# Patient Record
Sex: Male | Born: 1980 | Race: White | Hispanic: No | Marital: Married | Smoking: Former smoker
Health system: Southern US, Community
[De-identification: ages and names within clinical notes are randomized; demographics above are authoritative.]

## PROBLEM LIST (undated history)

## (undated) DIAGNOSIS — I10 Essential (primary) hypertension: Secondary | ICD-10-CM

## (undated) DIAGNOSIS — F4310 Post-traumatic stress disorder, unspecified: Secondary | ICD-10-CM

## (undated) DIAGNOSIS — F410 Panic disorder [episodic paroxysmal anxiety] without agoraphobia: Secondary | ICD-10-CM

## (undated) DIAGNOSIS — F419 Anxiety disorder, unspecified: Secondary | ICD-10-CM

## (undated) HISTORY — DX: Post-traumatic stress disorder, unspecified: F43.10

## (undated) HISTORY — DX: Anxiety disorder, unspecified: F41.9

## (undated) HISTORY — PX: SHOULDER ARTHROSCOPY: SHX128

## (undated) HISTORY — DX: Panic disorder (episodic paroxysmal anxiety): F41.0

---

## 2011-01-28 ENCOUNTER — Emergency Department (HOSPITAL_COMMUNITY)
Admission: EM | Admit: 2011-01-28 | Discharge: 2011-01-28 | Disposition: A | Discharge status: Home / Self Care | Payer: Medicaid Other | Attending: Emergency Medicine | Admitting: Emergency Medicine

## 2011-01-28 ENCOUNTER — Emergency Department (HOSPITAL_COMMUNITY): Payer: Medicaid Other

## 2011-01-28 DIAGNOSIS — W108XXA Fall (on) (from) other stairs and steps, initial encounter: Secondary | ICD-10-CM | POA: Insufficient documentation

## 2011-01-28 DIAGNOSIS — Z79899 Other long term (current) drug therapy: Secondary | ICD-10-CM | POA: Insufficient documentation

## 2011-01-28 DIAGNOSIS — IMO0002 Reserved for concepts with insufficient information to code with codable children: Secondary | ICD-10-CM | POA: Insufficient documentation

## 2011-01-28 DIAGNOSIS — M25519 Pain in unspecified shoulder: Secondary | ICD-10-CM | POA: Insufficient documentation

## 2011-01-28 DIAGNOSIS — I10 Essential (primary) hypertension: Secondary | ICD-10-CM | POA: Insufficient documentation

## 2011-03-15 ENCOUNTER — Emergency Department: Payer: Self-pay | Admitting: Emergency Medicine

## 2011-04-07 ENCOUNTER — Emergency Department (HOSPITAL_COMMUNITY)
Admission: EM | Admit: 2011-04-07 | Discharge: 2011-04-08 | Disposition: A | Discharge status: Home / Self Care | Payer: Medicaid Other | Attending: Emergency Medicine | Admitting: Emergency Medicine

## 2011-04-07 DIAGNOSIS — I10 Essential (primary) hypertension: Secondary | ICD-10-CM | POA: Insufficient documentation

## 2011-04-07 DIAGNOSIS — M25519 Pain in unspecified shoulder: Secondary | ICD-10-CM | POA: Insufficient documentation

## 2011-04-07 DIAGNOSIS — M2180 Other specified acquired deformities of unspecified limb: Secondary | ICD-10-CM | POA: Insufficient documentation

## 2011-04-07 HISTORY — DX: Essential (primary) hypertension: I10

## 2011-04-08 ENCOUNTER — Encounter: Payer: Self-pay | Admitting: *Deleted

## 2011-04-08 ENCOUNTER — Emergency Department (HOSPITAL_COMMUNITY): Payer: Medicaid Other

## 2011-04-08 MED ORDER — KETOROLAC TROMETHAMINE 30 MG/ML IJ SOLN
30.0000 mg | Freq: Once | INTRAMUSCULAR | Status: DC
  Filled 2011-04-08: qty 1

## 2011-04-08 NOTE — ED Provider Notes (Signed)
Medical screening examination/treatment/procedure(s) were performed by non-physician practitioner and as supervising physician I was immediately available for consultation/collaboration.  Lynnae Ludemann L Dyneisha Murchison, MD 04/08/11 2021 
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
## 2012-01-11 ENCOUNTER — Encounter (HOSPITAL_COMMUNITY): Payer: Self-pay

## 2012-01-11 ENCOUNTER — Emergency Department (HOSPITAL_COMMUNITY): Payer: Medicaid Other

## 2012-01-11 ENCOUNTER — Emergency Department (HOSPITAL_COMMUNITY)
Admission: EM | Admit: 2012-01-11 | Discharge: 2012-01-11 | Disposition: A | Discharge status: Home / Self Care | Payer: Medicaid Other | Attending: Emergency Medicine | Admitting: Emergency Medicine

## 2012-01-11 DIAGNOSIS — S46909A Unspecified injury of unspecified muscle, fascia and tendon at shoulder and upper arm level, unspecified arm, initial encounter: Secondary | ICD-10-CM | POA: Insufficient documentation

## 2012-01-11 DIAGNOSIS — M25511 Pain in right shoulder: Secondary | ICD-10-CM

## 2012-01-11 DIAGNOSIS — S4980XA Other specified injuries of shoulder and upper arm, unspecified arm, initial encounter: Secondary | ICD-10-CM | POA: Insufficient documentation

## 2012-01-11 DIAGNOSIS — Y93I9 Activity, other involving external motion: Secondary | ICD-10-CM | POA: Insufficient documentation

## 2012-01-11 DIAGNOSIS — Y998 Other external cause status: Secondary | ICD-10-CM | POA: Insufficient documentation

## 2012-01-11 DIAGNOSIS — Y92410 Unspecified street and highway as the place of occurrence of the external cause: Secondary | ICD-10-CM | POA: Insufficient documentation

## 2012-01-11 DIAGNOSIS — I10 Essential (primary) hypertension: Secondary | ICD-10-CM | POA: Insufficient documentation

## 2012-01-11 MED ORDER — OXYCODONE-ACETAMINOPHEN 5-325 MG PO TABS: 1.0000 | ORAL_TABLET | Freq: Four times a day (QID) | ORAL | Status: AC | PRN

## 2012-01-11 NOTE — Progress Notes (Signed)
Orthopedic Tech Progress Note Patient Details:  Jordan Wood Jun 26, 1980 [id number]  Ortho Devices Type of Ortho Device: Sling immobilizer Ortho Device/Splint Location: (R) UE Ortho Device/Splint Interventions: Application   Jennye Moccasin 01/11/2012, 7:14 PM
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
## 2015-12-05 DIAGNOSIS — D72829 Elevated white blood cell count, unspecified: Secondary | ICD-10-CM | POA: Diagnosis not present

## 2022-12-18 ENCOUNTER — Emergency Department (HOSPITAL_COMMUNITY): Payer: Medicaid Other

## 2022-12-18 ENCOUNTER — Emergency Department (HOSPITAL_COMMUNITY)
Admission: EM | Admit: 2022-12-18 | Discharge: 2022-12-18 | Disposition: A | Discharge status: Home / Self Care | Payer: Medicaid Other | Attending: Emergency Medicine | Admitting: Emergency Medicine

## 2022-12-18 ENCOUNTER — Encounter (HOSPITAL_COMMUNITY): Payer: Self-pay

## 2022-12-18 ENCOUNTER — Other Ambulatory Visit: Payer: Self-pay

## 2022-12-18 DIAGNOSIS — S42001K Fracture of unspecified part of right clavicle, subsequent encounter for fracture with nonunion: Secondary | ICD-10-CM | POA: Diagnosis not present

## 2022-12-18 DIAGNOSIS — I10 Essential (primary) hypertension: Secondary | ICD-10-CM | POA: Diagnosis not present

## 2022-12-18 DIAGNOSIS — D72829 Elevated white blood cell count, unspecified: Secondary | ICD-10-CM | POA: Insufficient documentation

## 2022-12-18 DIAGNOSIS — Z79899 Other long term (current) drug therapy: Secondary | ICD-10-CM | POA: Insufficient documentation

## 2022-12-18 DIAGNOSIS — S42001P Fracture of unspecified part of right clavicle, subsequent encounter for fracture with malunion: Secondary | ICD-10-CM

## 2022-12-18 DIAGNOSIS — R55 Syncope and collapse: Secondary | ICD-10-CM | POA: Diagnosis not present

## 2022-12-18 DIAGNOSIS — X58XXXA Exposure to other specified factors, initial encounter: Secondary | ICD-10-CM | POA: Diagnosis not present

## 2022-12-18 DIAGNOSIS — S4991XA Unspecified injury of right shoulder and upper arm, initial encounter: Secondary | ICD-10-CM | POA: Diagnosis present

## 2022-12-18 LAB — BASIC METABOLIC PANEL
Anion gap: 13 (ref 5–15)
BUN: 13 mg/dL (ref 6–20)
CO2: 25 mmol/L (ref 22–32)
Calcium: 9.1 mg/dL (ref 8.9–10.3)
Chloride: 102 mmol/L (ref 98–111)
Creatinine, Ser: 0.87 mg/dL (ref 0.61–1.24)
GFR, Estimated: >60 mL/min (ref >60)
Glucose, Bld: 77 mg/dL (ref 70–99)
Potassium: 4 mmol/L (ref 3.5–5.1)
Sodium: 140 mmol/L (ref 135–145)

## 2022-12-18 LAB — CBC WITH DIFFERENTIAL/PLATELET
Abs Immature Granulocytes: 0.03 10*3/uL (ref 0.00–0.07)
Basophils Absolute: 0.1 10*3/uL (ref 0.0–0.1)
Basophils Relative: 1 %
Eosinophils Absolute: 0.2 10*3/uL (ref 0.0–0.5)
Eosinophils Relative: 2 %
HCT: 50.6 % (ref 39.0–52.0)
Hemoglobin: 16.3 g/dL (ref 13.0–17.0)
Immature Granulocytes: 0 %
Lymphocytes Relative: 28 %
Lymphs Abs: 3.3 10*3/uL (ref 0.7–4.0)
MCH: 28.7 pg (ref 26.0–34.0)
MCHC: 32.2 g/dL (ref 30.0–36.0)
MCV: 89.2 fL (ref 80.0–100.0)
Monocytes Absolute: 0.8 10*3/uL (ref 0.1–1.0)
Monocytes Relative: 7 %
Neutro Abs: 7.4 10*3/uL (ref 1.7–7.7)
Neutrophils Relative %: 62 %
Platelets: 370 10*3/uL (ref 150–400)
RBC: 5.67 MIL/uL (ref 4.22–5.81)
RDW: 14.3 % (ref 11.5–15.5)
WBC: 11.8 10*3/uL — ABNORMAL HIGH (ref 4.0–10.5)
nRBC: 0 % (ref 0.0–0.2)

## 2022-12-18 LAB — CK: Total CK: 85 U/L (ref 49–397)

## 2022-12-18 MED ORDER — SODIUM CHLORIDE 0.9 % IV BOLUS
1000.0000 mL | Freq: Once | INTRAVENOUS | Status: AC
  Administered 2022-12-18: 1000 mL via INTRAVENOUS

## 2022-12-18 MED ORDER — CEPHALEXIN 500 MG PO CAPS: 500.0000 mg | ORAL_CAPSULE | Freq: Two times a day (BID) | ORAL | 0 refills | Status: AC

## 2022-12-18 MED ORDER — ACETAMINOPHEN 325 MG PO TABS: 650.0000 mg | ORAL_TABLET | Freq: Once | ORAL | Status: DC

## 2022-12-18 NOTE — Discharge Instructions (Addendum)
I have prescribed a short prescription to help treat your likely infection, please take 1 tablet twice a day for the next 7 days.  If you experience any fever, worsening symptoms you will need to return to the emergency department.
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
## 2023-01-14 ENCOUNTER — Encounter (HOSPITAL_COMMUNITY): Payer: Self-pay

## 2023-01-14 ENCOUNTER — Other Ambulatory Visit (HOSPITAL_COMMUNITY): Payer: Self-pay

## 2023-01-14 ENCOUNTER — Emergency Department (HOSPITAL_COMMUNITY)
Admission: EM | Admit: 2023-01-14 | Discharge: 2023-01-14 | Disposition: A | Discharge status: Home / Self Care | Payer: Medicaid Other | Attending: Emergency Medicine | Admitting: Emergency Medicine

## 2023-01-14 ENCOUNTER — Other Ambulatory Visit: Payer: Self-pay

## 2023-01-14 DIAGNOSIS — Z760 Encounter for issue of repeat prescription: Secondary | ICD-10-CM | POA: Insufficient documentation

## 2023-01-14 MED ORDER — GABAPENTIN 300 MG PO CAPS
300.0000 mg | ORAL_CAPSULE | Freq: Three times a day (TID) | ORAL | 0 refills | Status: DC
  Filled 2023-01-14: qty 42, 14d supply, fill #0

## 2023-01-14 MED ORDER — GABAPENTIN 300 MG PO CAPS: 300.0000 mg | ORAL_CAPSULE | Freq: Three times a day (TID) | ORAL | 0 refills | Status: DC

## 2023-01-14 NOTE — Discharge Instructions (Addendum)
You have been seen here today for your gabapentin medication refill.  Please attend your appointment on Wednesday for further management of your neuropathy and further refills.  Return to the ER if you have any new onset weakness, slurred speech, facial droop, increased numbness, any other new or concerning symptoms.

## 2023-01-14 NOTE — ED Triage Notes (Signed)
Pt c/o right wrist numbness and weaknessx1wk. Pt has 2+ right radial pulse, cap refill less than 3 sec, warm to touch, 3/5 right grip strength. Pt has decreased ROM of wrist.

## 2023-01-14 NOTE — ED Provider Notes (Signed)
[place] EMERGENCY DEPARTMENT AT [facility] Provider Note   CSN: [id number] Arrival date & time: 01/14/23  1254     History  Chief Complaint  Patient presents with   wrist numbness and weakness    Jordan Wood is a 42 y.o. male who presents with request for medication refill of his gabapentin.  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 He has had this neuropathy for years and has been on this medication without any side effects or concerns.  He states he has just moved here and does not have an appointment with his new doctor until this Wednesday.  Denies any new onset weakness, increased numbness, facial droop, slurred speech.   HPI     Home Medications Prior to Admission medications   Medication Sig Start Date End Date Taking? Authorizing Provider  gabapentin (NEURONTIN) 300 MG capsule Take 1 capsule (300 mg total) by mouth 3 (three) times daily for 14 days. 01/14/23 01/28/23 Yes Arabella Merles, PA-C  ALPRAZolam Prudy Feeler) 1 MG tablet Take 1 mg by mouth 2 (two) times daily.    [provider]  lithium carbonate 300 MG capsule Take 600 mg by mouth 2 (two) times daily with a meal.    [provider]  losartan (COZAAR) 50 MG tablet Take 50 mg by mouth daily.    [provider]      Allergies    Ibuprofen, Nsaids, Pristiq [desvenlafaxine succinate monohydrate], Sertraline hcl, and Ultram [tramadol hcl]    Review of Systems   Review of Systems  Constitutional:  Negative for fever.  Neurological:  Negative for weakness.    Physical Exam Updated Vital Signs BP (!) 156/106   Pulse 80   Temp 98.3 F (36.8 C) (Oral)   Resp 16   Ht 6\' 3"  (1.905 m)   Wt 83.9 kg   SpO2 100%   BMI 23.12 kg/m  Physical Exam Vitals and nursing note reviewed.  Constitutional:      Appearance: Normal appearance.  HENT:     Head: Atraumatic.  Cardiovascular:     Rate and Rhythm: Normal rate and regular  rhythm.  Pulmonary:     Effort: Pulmonary effort is normal.  Musculoskeletal:     Comments: Right hand with decreased strength with grip testing compared to left 3 out of 5 strength with right wrist extension and flexion, 5 out of 5 with left wrist extension and flexion  Intact sensation in the left and right upper extremities bilaterally  Neurological:     General: No focal deficit present.     Mental Status: He is alert.     Comments: Cranial nerves III through XII intact Patient able to ambulate without difficulty\ No slurred speech  Psychiatric:        Mood and Affect: Mood normal.        Behavior: Behavior normal.     ED Results / Procedures / Treatments   Labs (all labs ordered are listed, but only abnormal results are displayed) Labs Reviewed - No data to display  EKG None  Radiology No results found.  Procedures Procedures    Medications Ordered in ED Medications - No data to display  ED Course/ Medical Decision Making/ A&P                                 Medical Decision Making Risk Prescription  drug management.   42 y.o. male with pertinent past medical history of neuropathy of the right thumb presents to the ED for concern of medication refill of gabapentin     ED Course:  Patient presents requesting medication refill of his Gabapentin for his right thumb neuropathy. He states he has been on the medication for years and has not had any problems with it. He is on 300mg  TID. He just moved to the area and does not have an appointment with his doctor for another 2 days. He denies any new numbness or weakness in his right upper extremity. He does have decreased strength of the right wrist on exam as compared to the left, however patient states is his baseline. No facial droop, no slurred speech.   Impression: Medication refill  Disposition:  The patient was discharged home with instructions to take the prescribed gabapentin 300 mg 3 times daily for his  neuropathy.  He was given a 14-day supply.  Follow-up with his new doctor this Wednesday for further management of retinopathy and further refills. Return precautions given.            Final Clinical Impression(s) / ED Diagnoses Final diagnoses:  Medication refill    Rx / DC Orders ED Discharge Orders          Ordered    gabapentin (NEURONTIN) 300 MG capsule  3 times daily,   Status:  Discontinued        01/14/23 1603    gabapentin (NEURONTIN) 300 MG capsule  3 times daily        01/14/23 1622              Arabella Merles, PA-C 01/14/23 2319    Bethann Berkshire, MD 01/16/23 1141

## 2023-02-25 ENCOUNTER — Other Ambulatory Visit (HOSPITAL_COMMUNITY): Payer: Self-pay

## 2023-02-25 ENCOUNTER — Other Ambulatory Visit: Payer: Self-pay

## 2023-02-25 ENCOUNTER — Emergency Department (HOSPITAL_COMMUNITY)
Admission: EM | Admit: 2023-02-25 | Discharge: 2023-02-25 | Disposition: A | Discharge status: Home / Self Care | Payer: Medicaid Other

## 2023-02-25 ENCOUNTER — Encounter (HOSPITAL_COMMUNITY): Payer: Self-pay | Admitting: *Deleted

## 2023-02-25 DIAGNOSIS — J069 Acute upper respiratory infection, unspecified: Secondary | ICD-10-CM | POA: Diagnosis not present

## 2023-02-25 DIAGNOSIS — Z760 Encounter for issue of repeat prescription: Secondary | ICD-10-CM | POA: Insufficient documentation

## 2023-02-25 DIAGNOSIS — Z20822 Contact with and (suspected) exposure to covid-19: Secondary | ICD-10-CM | POA: Diagnosis not present

## 2023-02-25 DIAGNOSIS — J029 Acute pharyngitis, unspecified: Secondary | ICD-10-CM | POA: Diagnosis present

## 2023-02-25 LAB — RESP PANEL BY RT-PCR (RSV, FLU A&B, COVID)  RVPGX2
Influenza A by PCR: NEGATIVE
Influenza B by PCR: NEGATIVE
Resp Syncytial Virus by PCR: NEGATIVE
SARS Coronavirus 2 by RT PCR: NEGATIVE

## 2023-02-25 LAB — GROUP A STREP BY PCR: Group A Strep by PCR: NOT DETECTED

## 2023-02-25 MED ORDER — GABAPENTIN 300 MG PO CAPS
300.0000 mg | ORAL_CAPSULE | Freq: Three times a day (TID) | ORAL | 0 refills | Status: AC
  Filled 2023-02-25 – 2023-03-27 (×2): qty 15, 5d supply, fill #0

## 2023-02-25 NOTE — Discharge Instructions (Signed)
You were seen in the emergency room today for upper respiratory infection and medication refill  Your flu, COVID, strep throat test came back negative.  Please continue to stay hydrated drinking water supplementing Gatorade and Pedialyte as needed.  I recommend they follow-up with your primary care for todays visit findings and further medication refill.  Please return to the emergency room with any new or worsening symptoms.

## 2023-02-25 NOTE — ED Triage Notes (Signed)
Pt presents with 2 days of runny nose, sore throat and dry cough. He also needs a med refill until her can get to his appoint on 10/9.

## 2023-02-25 NOTE — ED Provider Notes (Signed)
[place] EMERGENCY DEPARTMENT AT [facility] Provider Note   CSN: [id number] Arrival date & time: 02/25/23  1351     History  Chief Complaint  Patient presents with   Medication Refill   Sore Throat   Cough    Jordan Wood is a 42 y.o. male pretension, anxiety, panic attack presenting for sore throat and medication refill.  Patient reports he has had sore throat and runny nose for 2 or 3 days.  Patient reports that his wife is sick.  Denies chest pain, shortness of breath, headache, abdominal.  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  Patient reports that he has primary care follow-up Oct 9th @2pm  and that was the soonest that he could be seen.  Patient is also requesting refill of Ativan for PRN anxiety.  Patient reports smoking, no drugs or alcohol use.   Medication Refill Sore Throat  Cough      Home Medications Prior to Admission medications   Medication Sig Start Date End Date Taking? Authorizing Provider  ALPRAZolam Prudy Feeler) 1 MG tablet Take 1 mg by mouth 2 (two) times daily.    [provider]  gabapentin (NEURONTIN) 300 MG capsule Take 1 capsule (300 mg total) by mouth 3 (three) times daily for 14 days. 01/14/23 01/28/23  Arabella Merles, PA-C  lithium carbonate 300 MG capsule Take 600 mg by mouth 2 (two) times daily with a meal.    [provider]  losartan (COZAAR) 50 MG tablet Take 50 mg by mouth daily.    [provider]      Allergies    Ibuprofen, Nsaids, Pristiq [desvenlafaxine succinate monohydrate], Sertraline hcl, and Ultram [tramadol hcl]    Review of Systems   Review of Systems  Respiratory:  Positive for cough.     Physical Exam Updated Vital Signs BP (!) 175/111 (BP Location: Left Arm)   Pulse (!) 107   Temp 99 F (37.2 C) (Oral)   Resp 16   Ht 6\' 2"  (1.88 m)   Wt 101.6 kg   SpO2 97%   BMI 28.76 kg/m  Physical Exam Vitals and nursing note  reviewed.  Constitutional:      General: He is not in acute distress.    Appearance: He is not toxic-appearing.  HENT:     Head: Normocephalic and atraumatic.     Nose: Rhinorrhea present. No congestion.     Mouth/Throat:     Pharynx: Posterior oropharyngeal erythema present. No oropharyngeal exudate.  Eyes:     General: No scleral icterus.    Conjunctiva/sclera: Conjunctivae normal.  Cardiovascular:     Rate and Rhythm: Normal rate and regular rhythm.     Pulses: Normal pulses.     Heart sounds: Normal heart sounds.  Pulmonary:     Effort: Pulmonary effort is normal. No respiratory distress.     Breath sounds: Normal breath sounds. No stridor. No wheezing or rales.  Abdominal:     General: Abdomen is flat. Bowel sounds are normal.     Palpations: Abdomen is soft.     Tenderness: There is no abdominal tenderness.  Musculoskeletal:     Right lower leg: No edema.     Left lower leg: No edema.  Skin:    General: Skin is warm and dry.     Capillary Refill: Capillary refill takes less than 2 seconds.     Findings: No lesion.  Neurological:     General: No  focal deficit present.     Mental Status: He is alert and oriented to person, place, and time. Mental status is at baseline.     Cranial Nerves: No cranial nerve deficit.     Sensory: No sensory deficit.     Motor: No weakness.     Gait: Gait normal.  Psychiatric:        Mood and Affect: Mood normal.        Thought Content: Thought content normal.     ED Results / Procedures / Treatments   Labs (all labs ordered are listed, but only abnormal results are displayed) Labs Reviewed - No data to display  EKG None  Radiology No results found.  Procedures Procedures    Medications Ordered in ED Medications - No data to display  ED Course/ Medical Decision Making/ A&P                                 Medical Decision Making Risk Prescription drug management.   Voshon Petro Sumas 42 y.o. presented  today for URI like symptoms. Working DDx that I considered at this time includes, but not limited to, viral illness, pharyngitis, mono, sinusitis, electrolyte abnormality, AOM.  R/o DDx: these additional diagnoses are not consistent with patient's history, presentation, physical exam, labs/imaging findings.  Review of prior external notes: None  Labs:  Respiratory Panel: Neg Group A Strep: Neg  Imaging:  None   Problem List / ED Course / Critical interventions / Medication management  Patient seen in the emergency room for URI symptoms, vital stable throughout stay, 98% on room air.  Patient's symptoms only been mild and he has not tried anything for treatment.  Flu COVID and strep came back negative, symptoms consistent with viral illness.  Recommended hydration and over-the-counter medications for symptom control.  Encourage patient to follow-up outpatient.  Sent refill medication of gabapentin which patient reports he has taken for neuropathy.  I ordered medication: none  Reevaluation of the patient after these medicines showed that the patient stayed the same Patients vitals assessed. Upon arrival patient is hemodynamically stable.  I have reviewed the patients home medicines and have made adjustments as needed     Plan: Sent short dose of medication refill of Gabapentin and encouraged patient to return to primary care provider for further refills Encourage hydration.  F/u w/ PCP in 2-3d to ensure resolution of sx.  Patient was given return precautions. Patient stable for discharge at this time.  Patient educated on sx and dx and verbalized understanding of plan. Return to ER if new or worsening sx.          Final Clinical Impression(s) / ED Diagnoses Final diagnoses:  None    Rx / DC Orders ED Discharge Orders     None         Smitty Knudsen, PA-C 02/25/23 2259    Coral Spikes, DO 02/26/23 1604

## 2023-03-04 ENCOUNTER — Other Ambulatory Visit (HOSPITAL_COMMUNITY): Payer: Self-pay

## 2023-03-07 ENCOUNTER — Other Ambulatory Visit (HOSPITAL_COMMUNITY): Payer: Self-pay

## 2023-03-18 ENCOUNTER — Other Ambulatory Visit: Payer: Self-pay

## 2023-03-21 ENCOUNTER — Emergency Department (HOSPITAL_COMMUNITY)
Admission: EM | Admit: 2023-03-21 | Discharge: 2023-03-21 | Disposition: A | Discharge status: Home / Self Care | Payer: Medicaid Other | Attending: Emergency Medicine | Admitting: Emergency Medicine

## 2023-03-21 ENCOUNTER — Other Ambulatory Visit: Payer: Self-pay

## 2023-03-21 DIAGNOSIS — T50901A Poisoning by unspecified drugs, medicaments and biological substances, accidental (unintentional), initial encounter: Secondary | ICD-10-CM | POA: Insufficient documentation

## 2023-03-21 DIAGNOSIS — Z79899 Other long term (current) drug therapy: Secondary | ICD-10-CM | POA: Diagnosis not present

## 2023-03-21 LAB — CBC WITH DIFFERENTIAL/PLATELET
Abs Immature Granulocytes: 0.04 10*3/uL (ref 0.00–0.07)
Basophils Absolute: 0.1 10*3/uL (ref 0.0–0.1)
Basophils Relative: 1 %
Eosinophils Absolute: 0.3 10*3/uL (ref 0.0–0.5)
Eosinophils Relative: 3 %
HCT: 48.7 % (ref 39.0–52.0)
Hemoglobin: 15.6 g/dL (ref 13.0–17.0)
Immature Granulocytes: 0 %
Lymphocytes Relative: 23 %
Lymphs Abs: 2.3 10*3/uL (ref 0.7–4.0)
MCH: 29.4 pg (ref 26.0–34.0)
MCHC: 32 g/dL (ref 30.0–36.0)
MCV: 91.9 fL (ref 80.0–100.0)
Monocytes Absolute: 0.5 10*3/uL (ref 0.1–1.0)
Monocytes Relative: 5 %
Neutro Abs: 7.1 10*3/uL (ref 1.7–7.7)
Neutrophils Relative %: 68 %
Platelets: 261 10*3/uL (ref 150–400)
RBC: 5.3 MIL/uL (ref 4.22–5.81)
RDW: 13.4 % (ref 11.5–15.5)
WBC: 10.3 10*3/uL (ref 4.0–10.5)
nRBC: 0 % (ref 0.0–0.2)

## 2023-03-21 LAB — SALICYLATE LEVEL: Salicylate Lvl: <7 mg/dL — ABNORMAL LOW (ref 7.0–30.0)

## 2023-03-21 LAB — ACETAMINOPHEN LEVEL: Acetaminophen (Tylenol), Serum: <10 ug/mL — ABNORMAL LOW (ref 10–30)

## 2023-03-21 LAB — COMPREHENSIVE METABOLIC PANEL
ALT: 11 U/L (ref 0–44)
AST: 16 U/L (ref 15–41)
Albumin: 3.7 g/dL (ref 3.5–5.0)
Alkaline Phosphatase: 40 U/L (ref 38–126)
Anion gap: 7 (ref 5–15)
BUN: 14 mg/dL (ref 6–20)
CO2: 25 mmol/L (ref 22–32)
Calcium: 8.5 mg/dL — ABNORMAL LOW (ref 8.9–10.3)
Chloride: 106 mmol/L (ref 98–111)
Creatinine, Ser: 1.1 mg/dL (ref 0.61–1.24)
GFR, Estimated: >60 mL/min (ref >60)
Glucose, Bld: 133 mg/dL — ABNORMAL HIGH (ref 70–99)
Potassium: 3.9 mmol/L (ref 3.5–5.1)
Sodium: 138 mmol/L (ref 135–145)
Total Bilirubin: 0.5 mg/dL (ref 0.3–1.2)
Total Protein: 5.9 g/dL — ABNORMAL LOW (ref 6.5–8.1)

## 2023-03-21 LAB — ETHANOL: Alcohol, Ethyl (B): <10 mg/dL (ref <10)

## 2023-03-21 NOTE — Discharge Instructions (Addendum)
Please stay away from illicit drugs and/or narcotic drugs.  You were given Narcan today which reverses opiates or opiate like drugs.  Follow-up with a primary care physician.  If you develop headache, trouble breathing, vomiting, or any other new/concerning symptoms then return to the ER or call 911.

## 2023-03-21 NOTE — ED Triage Notes (Signed)
Pt BIB [facility] from home/hotel. Per wife pt OD on unknown substance. Bystander administered 1.2mg  IM Narcan. Pt on 4L [place] on arrival.

## 2023-03-21 NOTE — ED Provider Notes (Signed)
[place] EMERGENCY DEPARTMENT AT [facility] Provider Note   CSN: [id number] Arrival date & time: 03/21/23  0004     History  Chief Complaint  Patient presents with   Drug Overdose    Jordan Wood is a 42 y.o. male.  Patient presents to the emergency room department via ambulance from hotel.  Wife told EMS that he overdosed on unknown substance.  EMS reports that the patient was given Narcan by a bystander and seems to have woken up.  Initially patient was unresponsive with respiratory rate of 4.  Vital signs are much improved now, patient still somnolent.       Home Medications Prior to Admission medications   Medication Sig Start Date End Date Taking? Authorizing Provider  ALPRAZolam Prudy Feeler) 1 MG tablet Take 1 mg by mouth 2 (two) times daily.    [provider]  gabapentin (NEURONTIN) 300 MG capsule Take 1 capsule (300 mg total) by mouth 3 (three) times daily for 5 days. 02/25/23 03/02/23  Barrett, Horald Chestnut, PA-C  lithium carbonate 300 MG capsule Take 600 mg by mouth 2 (two) times daily with a meal.    [provider]  losartan (COZAAR) 50 MG tablet Take 50 mg by mouth daily.    [provider]      Allergies    Ibuprofen, Nsaids, Pristiq [desvenlafaxine succinate monohydrate], Sertraline hcl, and Ultram [tramadol hcl]    Review of Systems   Review of Systems  Physical Exam Updated Vital Signs BP 122/76   Pulse 71   Temp (!) 97.4 F (36.3 C) (Axillary)   Resp (!) 9   Ht 6\' 3"  (1.905 m)   Wt 101.2 kg   SpO2 98%   BMI 27.87 kg/m  Physical Exam Vitals and nursing note reviewed.  Constitutional:      General: He is not in acute distress.    Appearance: He is well-developed.  HENT:     Head: Normocephalic and atraumatic.     Mouth/Throat:     Mouth: Mucous membranes are moist.  Eyes:     General: Vision grossly intact. Gaze aligned appropriately.     Extraocular Movements: Extraocular movements intact.      Conjunctiva/sclera: Conjunctivae normal.  Cardiovascular:     Rate and Rhythm: Normal rate and regular rhythm.     Pulses: Normal pulses.     Heart sounds: Normal heart sounds, S1 normal and S2 normal. No murmur heard.    No friction rub. No gallop.  Pulmonary:     Effort: Pulmonary effort is normal. No respiratory distress.     Breath sounds: Normal breath sounds.  Abdominal:     Palpations: Abdomen is soft.     Tenderness: There is no abdominal tenderness. There is no guarding or rebound.     Hernia: No hernia is present.  Musculoskeletal:        General: No swelling.     Cervical back: Full passive range of motion without pain, normal range of motion and neck supple. No pain with movement, spinous process tenderness or muscular tenderness. Normal range of motion.     Right lower leg: No edema.     Left lower leg: No edema.  Skin:    General: Skin is warm and dry.     Capillary Refill: Capillary refill takes less than 2 seconds.     Findings: No ecchymosis, erythema, lesion or wound.  Neurological:     Mental Status: He is lethargic.  Cranial Nerves: Cranial nerves 2-12 are intact.     Sensory: Sensation is intact.     Motor: Motor function is intact. No weakness or abnormal muscle tone.     Coordination: Coordination is intact.     ED Results / Procedures / Treatments   Labs (all labs ordered are listed, but only abnormal results are displayed) Labs Reviewed  COMPREHENSIVE METABOLIC PANEL - Abnormal; Notable for the following components:      Result Value   Glucose, Bld 133 (*)    Calcium 8.5 (*)    Total Protein 5.9 (*)    All other components within normal limits  ACETAMINOPHEN LEVEL - Abnormal; Notable for the following components:   Acetaminophen (Tylenol), Serum <10 (*)    All other components within normal limits  SALICYLATE LEVEL - Abnormal; Notable for the following components:   Salicylate Lvl <7.0 (*)    All other components within normal limits  CBC  WITH DIFFERENTIAL/PLATELET  ETHANOL  RAPID URINE DRUG SCREEN, HOSP PERFORMED    EKG EKG Interpretation Date/Time:  Thursday March 21 2023 00:08:58 EDT Ventricular Rate:  94 PR Interval:  167 QRS Duration:  111 QT Interval:  368 QTC Calculation: 461 R Axis:   35  Text Interpretation: Sinus rhythm Consider right atrial enlargement No acute changes Confirmed by Gilda Crease [phone]) on 03/21/2023 12:27:34 AM  Radiology No results found.  Procedures Procedures    Medications Ordered in ED Medications - No data to display  ED Course/ Medical Decision Making/ A&P                                 Medical Decision Making Amount and/or Complexity of Data Reviewed Labs: ordered.   Presents to the emergency department for evaluation of overdose of unknown illegal drug.  Patient did respond to Narcan prehospital.  At arrival he is somnolent but protecting his airway, oxygenating well.  Patient monitored overnight.  He awakens a little easier currently but still needs time to metabolize.  Will sign out to oncoming ER physician to recheck, anticipate discharge if patient confirms that this was not a suicide attempt.        Final Clinical Impression(s) / ED Diagnoses Final diagnoses:  Overdose of undetermined intent, initial encounter    Rx / DC Orders ED Discharge Orders     None         Duy Lemming, Canary Brim, MD 03/21/23 [phone]

## 2023-03-21 NOTE — ED Provider Notes (Addendum)
Care transferred to me.  Patient is awake, alert, and doing well.  Not requiring any oxygen.  Able to tolerate oral fluids.  Seems like he has metabolized what ever was in his system.  He otherwise appears stable for discharge as per prior plan. He denies this was a suicide attempt.    Pricilla Loveless, MD 03/21/23 [phone]

## 2023-03-27 ENCOUNTER — Other Ambulatory Visit (HOSPITAL_COMMUNITY): Payer: Self-pay

## 2023-03-29 ENCOUNTER — Other Ambulatory Visit (HOSPITAL_COMMUNITY): Payer: Self-pay

## 2023-04-01 ENCOUNTER — Other Ambulatory Visit (HOSPITAL_COMMUNITY): Payer: Self-pay
